# Patient Record
Sex: Female | Born: 1960 | Race: White | Hispanic: No | Marital: Single | Smoking: Former smoker
Health system: Southern US, Community
[De-identification: ages and names within clinical notes are randomized; demographics above are authoritative.]

## PROBLEM LIST (undated history)

## (undated) DIAGNOSIS — E079 Disorder of thyroid, unspecified: Secondary | ICD-10-CM

## (undated) DIAGNOSIS — F419 Anxiety disorder, unspecified: Secondary | ICD-10-CM

## (undated) DIAGNOSIS — F32A Depression, unspecified: Secondary | ICD-10-CM

## (undated) HISTORY — PX: JOINT REPLACEMENT: SHX530

---

## 2017-01-16 DIAGNOSIS — E559 Vitamin D deficiency, unspecified: Secondary | ICD-10-CM | POA: Diagnosis not present

## 2017-01-16 DIAGNOSIS — Z13220 Encounter for screening for lipoid disorders: Secondary | ICD-10-CM | POA: Diagnosis not present

## 2017-01-16 DIAGNOSIS — K219 Gastro-esophageal reflux disease without esophagitis: Secondary | ICD-10-CM | POA: Diagnosis not present

## 2017-01-16 DIAGNOSIS — R5383 Other fatigue: Secondary | ICD-10-CM | POA: Diagnosis not present

## 2017-02-14 DIAGNOSIS — R194 Change in bowel habit: Secondary | ICD-10-CM | POA: Diagnosis not present

## 2017-02-14 DIAGNOSIS — K219 Gastro-esophageal reflux disease without esophagitis: Secondary | ICD-10-CM | POA: Diagnosis not present

## 2017-02-14 DIAGNOSIS — R1310 Dysphagia, unspecified: Secondary | ICD-10-CM | POA: Diagnosis not present

## 2017-10-08 DIAGNOSIS — Z1231 Encounter for screening mammogram for malignant neoplasm of breast: Secondary | ICD-10-CM | POA: Diagnosis not present

## 2018-02-11 DIAGNOSIS — E538 Deficiency of other specified B group vitamins: Secondary | ICD-10-CM | POA: Diagnosis not present

## 2018-02-11 DIAGNOSIS — E559 Vitamin D deficiency, unspecified: Secondary | ICD-10-CM | POA: Diagnosis not present

## 2018-02-11 DIAGNOSIS — Z0000 Encounter for general adult medical examination without abnormal findings: Secondary | ICD-10-CM | POA: Diagnosis not present

## 2018-02-11 DIAGNOSIS — E7800 Pure hypercholesterolemia, unspecified: Secondary | ICD-10-CM | POA: Diagnosis not present

## 2018-02-11 DIAGNOSIS — Z1211 Encounter for screening for malignant neoplasm of colon: Secondary | ICD-10-CM | POA: Diagnosis not present

## 2018-02-11 DIAGNOSIS — Z79899 Other long term (current) drug therapy: Secondary | ICD-10-CM | POA: Diagnosis not present

## 2018-02-11 DIAGNOSIS — Z23 Encounter for immunization: Secondary | ICD-10-CM | POA: Diagnosis not present

## 2018-02-27 DIAGNOSIS — E538 Deficiency of other specified B group vitamins: Secondary | ICD-10-CM | POA: Diagnosis not present

## 2018-05-29 DIAGNOSIS — E559 Vitamin D deficiency, unspecified: Secondary | ICD-10-CM | POA: Diagnosis not present

## 2018-05-29 DIAGNOSIS — E538 Deficiency of other specified B group vitamins: Secondary | ICD-10-CM | POA: Diagnosis not present

## 2018-05-29 DIAGNOSIS — E7800 Pure hypercholesterolemia, unspecified: Secondary | ICD-10-CM | POA: Diagnosis not present

## 2018-08-20 DIAGNOSIS — H04122 Dry eye syndrome of left lacrimal gland: Secondary | ICD-10-CM | POA: Diagnosis not present

## 2018-10-01 DIAGNOSIS — B029 Zoster without complications: Secondary | ICD-10-CM | POA: Diagnosis not present

## 2018-10-01 DIAGNOSIS — N3000 Acute cystitis without hematuria: Secondary | ICD-10-CM | POA: Diagnosis not present

## 2019-02-07 DIAGNOSIS — B349 Viral infection, unspecified: Secondary | ICD-10-CM | POA: Diagnosis not present

## 2020-02-06 ENCOUNTER — Emergency Department (HOSPITAL_BASED_OUTPATIENT_CLINIC_OR_DEPARTMENT_OTHER)
Admission: EM | Admit: 2020-02-06 | Discharge: 2020-02-06 | Disposition: A | Discharge status: Home / Self Care | Payer: 59 | Attending: Emergency Medicine | Admitting: Emergency Medicine

## 2020-02-06 ENCOUNTER — Other Ambulatory Visit: Payer: Self-pay

## 2020-02-06 ENCOUNTER — Emergency Department (HOSPITAL_BASED_OUTPATIENT_CLINIC_OR_DEPARTMENT_OTHER): Payer: 59

## 2020-02-06 ENCOUNTER — Encounter (HOSPITAL_BASED_OUTPATIENT_CLINIC_OR_DEPARTMENT_OTHER): Payer: Self-pay | Admitting: *Deleted

## 2020-02-06 DIAGNOSIS — M79604 Pain in right leg: Secondary | ICD-10-CM | POA: Diagnosis not present

## 2020-02-06 DIAGNOSIS — R2243 Localized swelling, mass and lump, lower limb, bilateral: Secondary | ICD-10-CM | POA: Insufficient documentation

## 2020-02-06 DIAGNOSIS — Z87891 Personal history of nicotine dependence: Secondary | ICD-10-CM | POA: Diagnosis not present

## 2020-02-06 DIAGNOSIS — M79662 Pain in left lower leg: Secondary | ICD-10-CM | POA: Insufficient documentation

## 2020-02-06 DIAGNOSIS — M79605 Pain in left leg: Secondary | ICD-10-CM

## 2020-02-06 DIAGNOSIS — G8929 Other chronic pain: Secondary | ICD-10-CM

## 2020-02-06 DIAGNOSIS — M25512 Pain in left shoulder: Secondary | ICD-10-CM | POA: Insufficient documentation

## 2020-02-06 MED ORDER — LIDOCAINE 5 % EX PTCH: 1.0000 | MEDICATED_PATCH | CUTANEOUS | 0 refills | Status: AC

## 2020-02-06 MED ORDER — METHOCARBAMOL 500 MG PO TABS: 500.0000 mg | ORAL_TABLET | Freq: Two times a day (BID) | ORAL | 0 refills | Status: AC

## 2020-02-06 NOTE — Discharge Instructions (Addendum)
There was no evidence of blood clot in the veins of the lower extremities.  Take it easy, but do not lay around too much as this may make any stiffness worse.  Antiinflammatory medications: Take 600 mg of ibuprofen every 6 hours or 440 mg (over the counter dose) to 500 mg (prescription dose) of naproxen every 12 hours for the next 3 days. After this time, these medications may be used as needed for pain. Take these medications with food to avoid upset stomach. Choose only one of these medications, do not take them together. Acetaminophen (generic for Tylenol): Should you continue to have additional pain while taking the ibuprofen or naproxen, you may add in acetaminophen as needed. Your daily total maximum amount of acetaminophen from all sources should be limited to 4000mg /day for persons without liver problems, or 2000mg /day for those with liver problems. Methocarbamol: Methocarbamol (generic for Robaxin) is a muscle relaxer and can help relieve stiff muscles or muscle spasms.  Do not drive or perform other dangerous activities while taking this medication as it can cause drowsiness as well as changes in reaction time and judgement. Lidocaine patches: These are available via either prescription or over-the-counter. The over-the-counter option may be more economical one and are likely just as effective. There are multiple over-the-counter brands, such as Salonpas. Ice: May apply ice to the area over the next 24 hours for 15 minutes at a time to reduce pain, inflammation, and swelling, if present. Exercises: Be sure to perform the attached exercises starting with three times a week and working up to performing them daily. This is an essential part of preventing long term problems.  Follow up: Follow up with a primary care provider for any future management of these complaints. Be sure to follow up within 7-10 days.  It would also be a good idea to follow-up with a orthopedic specialist, especially for the  more chronic issues. Return: Return to the ED should symptoms worsen.  For prescription assistance, may try using prescription discount sites or apps, such as goodrx.com

## 2020-02-06 NOTE — ED Triage Notes (Signed)
Pt reports she has been packing and moving over the last week. Initially she had knee pain but then she began having pain in left leg with bruising and swelling in her left calf. Reports it feels like her foot is going numb. Also c/o left shoulder pain. Denies falling or known injury

## 2020-02-06 NOTE — ED Provider Notes (Signed)
[facility] EMERGENCY DEPARTMENT Provider Note   CSN: [id number] Arrival date & time: 02/06/20  1304     History Chief Complaint  Patient presents with  . Leg Pain    Kimberly Boyer is a 58 y.o. female.  HPI      Kimberly Boyer is a 59 y.o. female, patient with no diagnosed past medical history, presenting to the ED primarily complaining of left calf pain beginning several days ago.  At rest, she describes the pain as a soreness, 3/10, located in the posterior left calf, nonradiating.  With walking, however, she notes sharp, excruciating pain throughout the left calf.  She has also noted worsening pain overall as well as swelling and erythema.  A couple days ago she noted an area of bruising to the posterior left calf about the size of a half dollar.  Tingling in the left foot, especially with walking. She states she originally had left knee pain, but this improved and then the calf pain began. A couple days ago, she also noted to start having right calf tenderness and cramping. She adds that for the last couple weeks she has been packing and moving boxes and furniture around in anticipation of moving to a new residence that occurred 2 days ago.  She states she thought her leg pain was just from soreness from moving, but when she began to have swelling and increasingly worsened pain, she decided to get checked out. She also notes some acute on chronic lower back soreness and tightness in the muscles.  She mentions pain in the left shoulder for the past year that worsened over the past couple weeks with all of her activities. Declined analgesia during the initial interview. Denies fever/chills, shortness of breath, chest pain, cough, N/V/D, abdominal pain, syncope, changes in bowel or bladder function, saddle anesthesias, or any other complaints.   History reviewed. No pertinent past medical history.  There are no problems to display for this patient.   Past Surgical History:    Procedure Laterality Date  . CESAREAN SECTION       OB History   No obstetric history on file.     No family history on file.  Social History   Tobacco Use  . Smoking status: Former Research scientist (life sciences)  . Smokeless tobacco: Never Used  Substance Use Topics  . Alcohol use: Never  . Drug use: Never    Home Medications Prior to Admission medications   Medication Sig Start Date End Date Taking? Authorizing Provider  lidocaine (LIDODERM) 5 % Place 1 patch onto the skin daily. Remove & Discard patch within 12 hours or as directed by MD 02/06/20   Esta Carmon C, PA-C  methocarbamol (ROBAXIN) 500 MG tablet Take 1 tablet (500 mg total) by mouth 2 (two) times daily. 02/06/20   Valery Amedee, Helane Gunther, PA-C    Allergies    Patient has no known allergies.  Review of Systems   Review of Systems  Constitutional: Negative for chills, diaphoresis and fever.  Respiratory: Negative for cough and shortness of breath.   Cardiovascular: Positive for leg swelling. Negative for chest pain.  Gastrointestinal: Negative for abdominal pain, diarrhea, nausea and vomiting.  Genitourinary: Negative for difficulty urinating.  Musculoskeletal: Positive for back pain and myalgias.  Neurological: Negative for dizziness, syncope, weakness and numbness.  All other systems reviewed and are negative.   Physical Exam Updated Vital Signs BP 100/63 (BP Location: Right Arm)   Pulse 87   Temp 98.6 F (37 C) (Oral)  Resp 16   Ht 5\' 2"  (1.575 m)   Wt 106.6 kg   SpO2 100%   BMI 42.98 kg/m   Physical Exam Vitals and nursing note reviewed.  Constitutional:      General: She is not in acute distress.    Appearance: She is well-developed. She is not diaphoretic.  HENT:     Head: Normocephalic and atraumatic.     Mouth/Throat:     Mouth: Mucous membranes are moist.     Pharynx: Oropharynx is clear.  Eyes:     Conjunctiva/sclera: Conjunctivae normal.  Cardiovascular:     Rate and Rhythm: Normal rate and regular rhythm.      Pulses: Normal pulses.          Radial pulses are 2+ on the right side and 2+ on the left side.       Dorsalis pedis pulses are 2+ on the right side and 2+ on the left side.       Posterior tibial pulses are 2+ on the right side and 2+ on the left side.     Heart sounds: Normal heart sounds.     Comments: Tactile temperature in the extremities appropriate and equal bilaterally. Pulmonary:     Effort: Pulmonary effort is normal. No respiratory distress.     Breath sounds: Normal breath sounds.  Abdominal:     Palpations: Abdomen is soft.     Tenderness: There is no abdominal tenderness. There is no guarding.  Musculoskeletal:     Cervical back: Neck supple.     Right lower leg: No edema.     Left lower leg: No edema.     Comments: Left lower extremity: Patient certainly has tenderness throughout the left calf, most tender to the mid posterior left calf.  No noted objective size or color difference.  No increased warmth noted. She has some tenderness to the posterior left knee.  No swelling, deformity, color change, or increased warmth to the left knee.  No abnormalities noted in the left upper leg.  Full range of motion in the left knee and ankle without noted difficulty or pain in the joints.  However, she does have pain in the left calf with movement of the ankle. No noted pain or tenderness in the left hip.  Right lower extremity: She has an area of point tenderness to the right posterior mid calf without color change, noted swelling, increased warmth, or erythema. No noted pain or tenderness in the right hip.  Patient certainly has tenderness to left anterior shoulder.  She has limited range of motion with abduction limited to about 90 degrees.  She states this is not new and has been this way for at least a year. No noted abnormalities in the left elbow or wrist.  Tenderness along the musculature of the lumbar back.  Lymphadenopathy:     Cervical: No cervical adenopathy.  Skin:     General: Skin is warm and dry.  Neurological:     Mental Status: She is alert.     Comments: Sensation to light touch grossly intact in each of the extremities.  However, patient does note a tingling sensation when most of the left foot is palpated.  This is in a low sock type of distribution inferior to the malleoli.  Strength against resistance intact in the left big toe.   Strength with flexion and extension intact in the left ankle and knee.  Patient is ambulatory with a limping gait.  Strength 5/5  throughout the right lower extremity.  Strength 5/5 in the left elbow and wrist.  Strength in the left shoulder seems to be limited by pain.  Psychiatric:        Mood and Affect: Mood and affect normal.        Speech: Speech normal.        Behavior: Behavior normal.     ED Results / Procedures / Treatments   Labs (all labs ordered are listed, but only abnormal results are displayed) Labs Reviewed - No data to display  EKG None  Radiology US Venous Img Lower Bilateral  Result Date: 02/06/2020 CLINICAL DATA:  Bilateral calf cramping and pain, left foot numbness EXAM: BILATERAL LOWER EXTREMITY VENOUS DOPPLER ULTRASOUND TECHNIQUE: Gray-scale sonography with graded compression, as well as color Doppler and duplex ultrasound were performed to evaluate the lower extremity deep venous systems from the level of the common femoral vein and including the common femoral, femoral, profunda femoral, popliteal and calf veins including the posterior tibial, peroneal and gastrocnemius veins when visible. The superficial great saphenous vein was also interrogated. Spectral Doppler was utilized to evaluate flow at rest and with distal augmentation maneuvers in the common femoral, femoral and popliteal veins. COMPARISON:  None. FINDINGS: RIGHT LOWER EXTREMITY Common Femoral Vein: No evidence of thrombus. Normal compressibility, respiratory phasicity and response to augmentation. Saphenofemoral Junction: No  evidence of thrombus. Normal compressibility and flow on color Doppler imaging. Profunda Femoral Vein: No evidence of thrombus. Normal compressibility and flow on color Doppler imaging. Femoral Vein: No evidence of thrombus. Normal compressibility, respiratory phasicity and response to augmentation. Popliteal Vein: No evidence of thrombus. Normal compressibility, respiratory phasicity and response to augmentation. Calf Veins: No evidence of thrombus. Normal compressibility and flow on color Doppler imaging. Superficial Great Saphenous Vein: No evidence of thrombus. Normal compressibility. Venous Reflux:  None. Other Findings:  None. LEFT LOWER EXTREMITY Common Femoral Vein: No evidence of thrombus. Normal compressibility, respiratory phasicity and response to augmentation. Saphenofemoral Junction: No evidence of thrombus. Normal compressibility and flow on color Doppler imaging. Profunda Femoral Vein: No evidence of thrombus. Normal compressibility and flow on color Doppler imaging. Femoral Vein: No evidence of thrombus. Normal compressibility, respiratory phasicity and response to augmentation. Popliteal Vein: No evidence of thrombus. Normal compressibility, respiratory phasicity and response to augmentation. Calf Veins: No evidence of thrombus. Normal compressibility and flow on color Doppler imaging. Superficial Great Saphenous Vein: No evidence of thrombus. Normal compressibility. Venous Reflux:  None. Other Findings:  None. IMPRESSION: No evidence of deep venous thrombosis in either lower extremity. Electronically Signed   By: Sharlet Salina M.D.   On: 02/06/2020 15:55    Procedures Procedures (including critical care time)  Medications Ordered in ED Medications - No data to display  ED Course  I have reviewed the triage vital signs and the nursing notes.  Pertinent labs & imaging results that were available during my care of the patient were reviewed by me and considered in my medical decision  making (see chart for details).    MDM Rules/Calculators/A&P                      Patient presents primarily complaining of calf pain. Also has pain in her lower back and left shoulder, but these are not acute. No evidence of neurovascular compromise. Venous Doppler ultrasounds of bilateral lower extremities without evidence of DVT. Patient referred to orthopedics for her other areas of pain. The patient was given instructions for home  care as well as return precautions. Patient voices understanding of these instructions, accepts the plan, and is comfortable with discharge.  Final Clinical Impression(s) / ED Diagnoses Final diagnoses:  Pain in both lower extremities  Chronic left shoulder pain    Rx / DC Orders ED Discharge Orders         Ordered    methocarbamol (ROBAXIN) 500 MG tablet  2 times daily     02/06/20 1628    lidocaine (LIDODERM) 5 %  Every 24 hours     02/06/20 1628           Anselm Pancoast, PA-C 02/06/20 1643    Tegeler, Canary Brim, MD 02/10/20 1416

## 2020-02-15 ENCOUNTER — Other Ambulatory Visit: Payer: Self-pay

## 2020-02-15 ENCOUNTER — Encounter: Payer: Self-pay | Admitting: Orthopaedic Surgery

## 2020-02-15 ENCOUNTER — Ambulatory Visit (INDEPENDENT_AMBULATORY_CARE_PROVIDER_SITE_OTHER): Payer: 59 | Admitting: Orthopaedic Surgery

## 2020-02-15 ENCOUNTER — Ambulatory Visit: Payer: Self-pay

## 2020-02-15 VITALS — BP 153/86 | HR 93 | Ht 62.0 in | Wt 235.0 lb

## 2020-02-15 DIAGNOSIS — M545 Low back pain, unspecified: Secondary | ICD-10-CM

## 2020-02-15 DIAGNOSIS — M5442 Lumbago with sciatica, left side: Secondary | ICD-10-CM | POA: Diagnosis not present

## 2020-02-15 NOTE — Progress Notes (Signed)
Office Visit Note   Patient: Kimberly Boyer           Date of Birth: 20-Jun-1961           MRN: [id number] Visit Date: 02/15/2020              Requested by: Mila Palmer, MD [address] [place],  [place] [address] PCP: Mila Palmer, MD   Assessment & Plan: Visit Diagnoses:  1. Acute bilateral low back pain, unspecified whether sciatica present   2. Acute left-sided low back pain with left-sided sciatica     Plan: Patient had a cortisone injection last week in her left knee without improvement.  She is having back and left leg pain with numbness.  We will set her up for some physical therapy she is concerned about the cost with the insurance that she has and would like to discuss with them a home exercise program that she can work on.  I plan to recheck her in a month if she is having persistent symptoms we will need to consider MRI imaging of the lumbar spine.  Follow-Up Instructions: Return in about 1 month (around 03/14/2020).   Orders:  Orders Placed This Encounter  Procedures  . XR Lumbar Spine 2-3 Views  . Ambulatory referral to Physical Therapy   No orders of the defined types were placed in this encounter.     Procedures: No procedures performed   Clinical Data: No additional findings.   Subjective: Chief Complaint  Patient presents with  . Lower Back - Pain  . Left Leg - Pain    HPI 59 year old female who was seen [facility] week saw the PA was not happy she did not see Dr. Thomasena Edis.  She states she had moved houses had move her stomach but she did a lot of packing bending and had increased back pain left leg pain.  Husband passed away a few years ago and she states she did put on some weight.  She denied problems with radicular pain prior to the packing she did from the moving.  She states she had an injection in her left knee but continues to have back pain left calf pain some left foot numbness.  She is occasionally had  cramping in the calf.  She feels like her left knee will hyperextend at times.  She used some Robaxin also some ibuprofen.  Negative bowel or bladder symptoms no fever chills associated.  Review of Systems positive for back pain left leg more than right leg pain negative Doppler test for DVT.  No popliteal cyst noted.  Positive for weight gain last years after her husband's death.  Negative for diabetes negative for chest pain or stroke symptoms.    Objective: Vital Signs: BP (!) 153/86   Pulse 93   Ht 5\' 2"  (1.575 m)   Wt 235 lb (106.6 kg)   BMI 42.98 kg/m   Physical Exam Constitutional:      Appearance: She is well-developed.  HENT:     Head: Normocephalic.     Right Ear: External ear normal.     Left Ear: External ear normal.  Eyes:     Pupils: Pupils are equal, round, and reactive to light.  Neck:     Thyroid: No thyromegaly.     Trachea: No tracheal deviation.  Cardiovascular:     Rate and Rhythm: Normal rate.  Pulmonary:     Effort: Pulmonary effort is normal.  Abdominal:  Palpations: Abdomen is soft.  Skin:    General: Skin is warm and dry.  Neurological:     Mental Status: She is alert and oriented to person, place, and time.  Psychiatric:        Behavior: Behavior normal.     Ortho Exam patient has sharp pain with straight leg raising on the left at 60 degrees.  She extends at the hip.  Pain with popliteal compression.  Lateral plantar foot numbness.  Peroneals are strong knee and ankle jerk are intact.  Mild trochanteric tenderness some sciatic notch tenderness left greater than right mild discomfort with palpation over the lumbosacral junction.  No midline defects.  Anterior tib gastrocsoleus is strong she is able to heel and toe walk with slight difficulty.  No crepitus with left knee range of motion collateral crucial ligament exam is normal left knee.  Specialty Comments:  No specialty comments available.  Imaging: XR Lumbar Spine 2-3 Views  Result  Date: 02/15/2020 AP lateral lumbar spine demonstrate normal lumbar curvature no disc base narrowing negative for acute changes. Impression: Lumbar x-rays negative for acute changes.    PMFS History: Patient Active Problem List   Diagnosis Date Noted  . Low back pain 02/15/2020   No past medical history on file.  No family history on file.  Past Surgical History:  Procedure Laterality Date  . CESAREAN SECTION     Social History   Occupational History  . Not on file  Tobacco Use  . Smoking status: Former Research scientist (life sciences)  . Smokeless tobacco: Never Used  Substance and Sexual Activity  . Alcohol use: Never  . Drug use: Never  . Sexual activity: Not on file

## 2020-04-24 ENCOUNTER — Encounter (HOSPITAL_BASED_OUTPATIENT_CLINIC_OR_DEPARTMENT_OTHER): Payer: Self-pay | Admitting: *Deleted

## 2020-04-24 ENCOUNTER — Other Ambulatory Visit: Payer: Self-pay

## 2020-04-24 ENCOUNTER — Emergency Department (HOSPITAL_BASED_OUTPATIENT_CLINIC_OR_DEPARTMENT_OTHER): Payer: 59

## 2020-04-24 ENCOUNTER — Emergency Department (HOSPITAL_BASED_OUTPATIENT_CLINIC_OR_DEPARTMENT_OTHER)
Admission: EM | Admit: 2020-04-24 | Discharge: 2020-04-24 | Disposition: A | Discharge status: Home / Self Care | Payer: 59 | Attending: Emergency Medicine | Admitting: Emergency Medicine

## 2020-04-24 DIAGNOSIS — Y9389 Activity, other specified: Secondary | ICD-10-CM | POA: Diagnosis not present

## 2020-04-24 DIAGNOSIS — Y929 Unspecified place or not applicable: Secondary | ICD-10-CM | POA: Insufficient documentation

## 2020-04-24 DIAGNOSIS — S42351A Displaced comminuted fracture of shaft of humerus, right arm, initial encounter for closed fracture: Secondary | ICD-10-CM | POA: Insufficient documentation

## 2020-04-24 DIAGNOSIS — W01198A Fall on same level from slipping, tripping and stumbling with subsequent striking against other object, initial encounter: Secondary | ICD-10-CM | POA: Insufficient documentation

## 2020-04-24 DIAGNOSIS — S42201A Unspecified fracture of upper end of right humerus, initial encounter for closed fracture: Secondary | ICD-10-CM

## 2020-04-24 DIAGNOSIS — Y999 Unspecified external cause status: Secondary | ICD-10-CM | POA: Insufficient documentation

## 2020-04-24 DIAGNOSIS — S0990XA Unspecified injury of head, initial encounter: Secondary | ICD-10-CM | POA: Diagnosis not present

## 2020-04-24 DIAGNOSIS — S4991XA Unspecified injury of right shoulder and upper arm, initial encounter: Secondary | ICD-10-CM | POA: Insufficient documentation

## 2020-04-24 DIAGNOSIS — S161XXA Strain of muscle, fascia and tendon at neck level, initial encounter: Secondary | ICD-10-CM | POA: Diagnosis not present

## 2020-04-24 DIAGNOSIS — W19XXXA Unspecified fall, initial encounter: Secondary | ICD-10-CM

## 2020-04-24 DIAGNOSIS — S199XXA Unspecified injury of neck, initial encounter: Secondary | ICD-10-CM | POA: Diagnosis present

## 2020-04-24 DIAGNOSIS — S0081XA Abrasion of other part of head, initial encounter: Secondary | ICD-10-CM | POA: Diagnosis not present

## 2020-04-24 MED ORDER — OXYCODONE-ACETAMINOPHEN 5-325 MG PO TABS: 1.0000 | ORAL_TABLET | Freq: Four times a day (QID) | ORAL | 0 refills | Status: AC | PRN

## 2020-04-24 MED ORDER — NAPROXEN 500 MG PO TABS: 500.0000 mg | ORAL_TABLET | Freq: Two times a day (BID) | ORAL | 0 refills | Status: AC | PRN

## 2020-04-24 NOTE — ED Triage Notes (Signed)
Pt reports falling tonight and landing on her right shoulder-deformity noted. Also reports hitting head, denies LOC. No laceration or abrasion noted.

## 2020-04-24 NOTE — Discharge Instructions (Signed)
You were seen in the emergency department today after fall.  You do have a fracture of the right upper arm.  Please keep the sling in place and apply ice to help reduce swelling over the next 24 hours.  Please call the orthopedic surgeon first thing tomorrow morning to schedule a follow-up appointment in the coming week.  They will discuss management options with you moving forward.  I have prescribed a pain medication called Percocet.  You cannot take other sedating or pain medications along with this.  Do not drink alcohol with this medication.  You cannot drive while taking this medication.  It can cause some constipation and you can take over-the-counter constipation medications for this.  You can also take Tylenol and/or Motrin but be advised that Percocet does include a small amount of Tylenol and you should consider this when dosing additional Tylenol so that you do not take more than 4000 mg in a 24 hour period.   Return to the emergency department any new or suddenly worsening symptoms.

## 2020-04-24 NOTE — ED Provider Notes (Signed)
Emergency Department Provider Note   I have reviewed the triage vital signs and the nursing notes.   HISTORY  Chief Complaint Fall   HPI Kimberly Boyer is a 59 y.o. female with past medical history of lower back pain presents to the emergency department after mechanical fall at home.  Patient states that she was in some boots and thinks she may have either tripped on the carpet or slipped on the wet tile as she came into the house.  She fell on her right side striking her forehead on the fireplace.  She denies loss of consciousness.  No near-syncope symptoms prior to falling.  She did not try to catch herself on her outstretched hand.  She landed primarily on the right shoulder and had severe pain in that area which is worse with movement.  No pain in the elbow or wrist on the right or left upper extremity.  No pain in the legs, back, abdomen, chest.  She denies shortness of breath.  No loss of consciousness.  Patient is not on blood thinners.  Denies numbness.     History reviewed. No pertinent past medical history.  Patient Active Problem List   Diagnosis Date Noted  . Low back pain 02/15/2020    Past Surgical History:  Procedure Laterality Date  . CESAREAN SECTION      Allergies Patient has no known allergies.  History reviewed. No pertinent family history.  Social History Social History   Tobacco Use  . Smoking status: Former Research scientist (life sciences)  . Smokeless tobacco: Never Used  Substance Use Topics  . Alcohol use: Never  . Drug use: Never    Review of Systems  Constitutional: No fever/chills Eyes: No visual changes. ENT: No sore throat. Cardiovascular: Denies chest pain. Respiratory: Denies shortness of breath. Gastrointestinal: No abdominal pain.  No nausea, no vomiting.  No diarrhea.  No constipation. Genitourinary: Negative for dysuria. Musculoskeletal: Chronic back pain. Positive right shoulder pain.  Skin: Negative for rash. Neurological: Negative for headaches,  focal weakness or numbness.  10-point ROS otherwise negative.  ____________________________________________   PHYSICAL EXAM:  VITAL SIGNS: ED Triage Vitals  Enc Vitals Group     BP 04/24/20 1943 93/62     Pulse Rate 04/24/20 1941 92     Resp 04/24/20 1941 18     Temp 04/24/20 1941 99.8 F (37.7 C)     Temp Source 04/24/20 1941 Oral     SpO2 04/24/20 1941 98 %     Weight 04/24/20 1942 235 lb (106.6 kg)     Height 04/24/20 1942 5\' 2"  (1.575 m)   Constitutional: Alert and oriented. Well appearing and in no acute distress. Eyes: Conjunctivae are normal.  Head: Small abrasion over the right forehead without hematoma or laceration.  Nose: No congestion/rhinnorhea. Mouth/Throat: Mucous membranes are moist.  Neck: No stridor.  No cervical spine tenderness to palpation. Cardiovascular: Normal rate, regular rhythm. Good peripheral circulation. Grossly normal heart sounds.   Respiratory: Normal respiratory effort.  No retractions. Lungs CTAB. Gastrointestinal: Soft and nontender. No distention.  Musculoskeletal: Focal tenderness over the right upper arm with swelling.  No tenderness over the right elbow or wrist.  Normal neurovascular exam of the right upper extremity.  Normal range of motion of the left upper extremity and bilateral lower extremities. No focal tenderness.  Neurologic:  Normal speech and language. No gross focal neurologic deficits are appreciated.  Skin:  Skin is warm, dry and intact. No rash noted.  ____________________________________________  [id number]  DG Shoulder Right  Result Date: 04/24/2020 CLINICAL DATA:  Deformity after a fall EXAM: RIGHT SHOULDER - 2+ VIEW COMPARISON:  None. FINDINGS: Humeral head projects over the glenoid fossa. Acute comminuted fracture involving the neck and proximal shaft of the humerus. Mild valgus and posterior angulation of distal fracture fragment. IMPRESSION: Acute comminuted and angulated fracture involving the neck and proximal  shaft of the humerus. Electronically Signed   By: Jasmine Pang M.D.   On: 04/24/2020 20:18   CT HEAD WO CONTRAST  Result Date: 04/24/2020 CLINICAL DATA:  Fall. Head trauma. Headache. EXAM: CT HEAD WITHOUT CONTRAST CT CERVICAL SPINE WITHOUT CONTRAST TECHNIQUE: Multidetector CT imaging of the head and cervical spine was performed following the standard protocol without intravenous contrast. Multiplanar CT image reconstructions of the cervical spine were also generated. COMPARISON:  None. FINDINGS: CT HEAD FINDINGS Brain: There is no evidence for acute hemorrhage, hydrocephalus, mass lesion, or abnormal extra-axial fluid collection. No definite CT evidence for acute infarction. Vascular: No hyperdense vessel or unexpected calcification. Skull: No evidence for fracture. No worrisome lytic or sclerotic lesion. Sinuses/Orbits: The visualized paranasal sinuses and mastoid air cells are clear. Visualized portions of the globes and intraorbital fat are unremarkable. Other: None. CT CERVICAL SPINE FINDINGS Alignment: Normal. Skull base and vertebrae: No acute fracture. No primary bone lesion or focal pathologic process. Soft tissues and spinal canal: No prevertebral fluid or swelling. No visible canal hematoma. Disc levels:  Loss of disc height noted C5-6. Upper chest: Unremarkable Other: None. IMPRESSION: 1. No acute intracranial abnormality. 2. No cervical spine fracture. Loss of disc height noted at C5-6. Electronically Signed   By: Kennith Center M.D.   On: 04/24/2020 20:26   CT Cervical Spine Wo Contrast  Result Date: 04/24/2020 CLINICAL DATA:  Fall. Head trauma. Headache. EXAM: CT HEAD WITHOUT CONTRAST CT CERVICAL SPINE WITHOUT CONTRAST TECHNIQUE: Multidetector CT imaging of the head and cervical spine was performed following the standard protocol without intravenous contrast. Multiplanar CT image reconstructions of the cervical spine were also generated. COMPARISON:  None. FINDINGS: CT HEAD FINDINGS Brain: There  is no evidence for acute hemorrhage, hydrocephalus, mass lesion, or abnormal extra-axial fluid collection. No definite CT evidence for acute infarction. Vascular: No hyperdense vessel or unexpected calcification. Skull: No evidence for fracture. No worrisome lytic or sclerotic lesion. Sinuses/Orbits: The visualized paranasal sinuses and mastoid air cells are clear. Visualized portions of the globes and intraorbital fat are unremarkable. Other: None. CT CERVICAL SPINE FINDINGS Alignment: Normal. Skull base and vertebrae: No acute fracture. No primary bone lesion or focal pathologic process. Soft tissues and spinal canal: No prevertebral fluid or swelling. No visible canal hematoma. Disc levels:  Loss of disc height noted C5-6. Upper chest: Unremarkable Other: None. IMPRESSION: 1. No acute intracranial abnormality. 2. No cervical spine fracture. Loss of disc height noted at C5-6. Electronically Signed   By: Kennith Center M.D.   On: 04/24/2020 20:26    ____________________________________________   PROCEDURES  Procedure(s) performed:   Procedures  None  ____________________________________________   INITIAL IMPRESSION / ASSESSMENT AND PLAN / ED COURSE  Pertinent labs & imaging results that were available during my care of the patient were reviewed by me and considered in my medical decision making (see chart for details).   Patient presents emergency department for evaluation of acute onset right shoulder pain after falling.  Plain film shows comminuted, angulated proximal humerus fracture on the right.  No additional areas of tenderness on my exam.  No concern  clinically for compartment syndrome.  The extremity is neurovascularly intact.  Fall appears to have been mechanical.  CT head and cervical spine reviewed with no acute findings.  Patient placed in sling which has significantly improved her discomfort.  She did not require pain medications here in the emergency department.  I reviewed the  [place] drug database and will send the patient home with a prescription for Percocet.  We discussed the risks and benefits of this medication.  She will call the orthopedic surgeon tomorrow morning to schedule follow-up.  Contact information provided.  Also provided a work note as she works in an office setting with lots of typing. She is right hand dominant.    ____________________________________________  FINAL CLINICAL IMPRESSION(S) / ED DIAGNOSES  Final diagnoses:  Fall, initial encounter  Injury of head, initial encounter  Strain of neck muscle, initial encounter  Closed fracture of proximal end of right humerus, unspecified fracture morphology, initial encounter     NEW OUTPATIENT MEDICATIONS STARTED DURING THIS VISIT:  New Prescriptions   NAPROXEN (NAPROSYN) 500 MG TABLET    Take 1 tablet (500 mg total) by mouth 2 (two) times daily as needed for moderate pain.   OXYCODONE-ACETAMINOPHEN (PERCOCET/ROXICET) 5-325 MG TABLET    Take 1 tablet by mouth every 6 (six) hours as needed for severe pain.    Note:  This document was prepared using Dragon voice recognition software and may include unintentional dictation errors.  Alona Bene, MD, [facility]    Katrinia Straker, Arlyss Repress, MD 04/24/20 2041

## 2021-01-01 IMAGING — CT CT HEAD W/O CM
3 series · 15 of 47 positions shown, 18 images · non-contrast
Comparison: None.

CLINICAL DATA: Fall. Head trauma. Headache.

EXAM:
CT HEAD WITHOUT CONTRAST
CT CERVICAL SPINE WITHOUT CONTRAST
TECHNIQUE: Multidetector CT imaging of the head and cervical spine was
performed following the standard protocol without intravenous
contrast. Multiplanar CT image reconstructions of the cervical spine
were also generated.

[Series 2: head 5.0 h30s · axial · 0.46mm/px · z∈[+1388,+1518]mm · 9 of 32 slices shown, 12 images]
[im 3/32  brain]
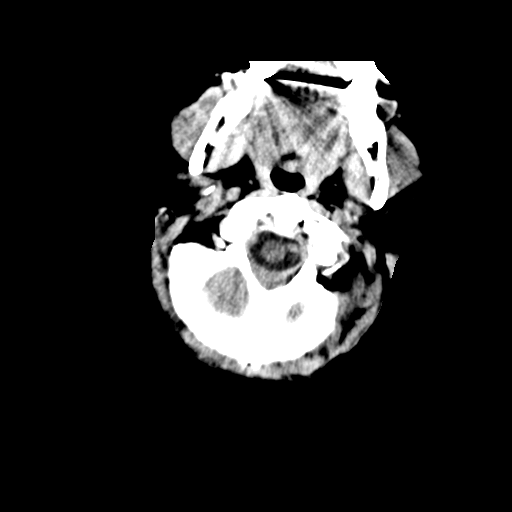
[im 3/32  bone]
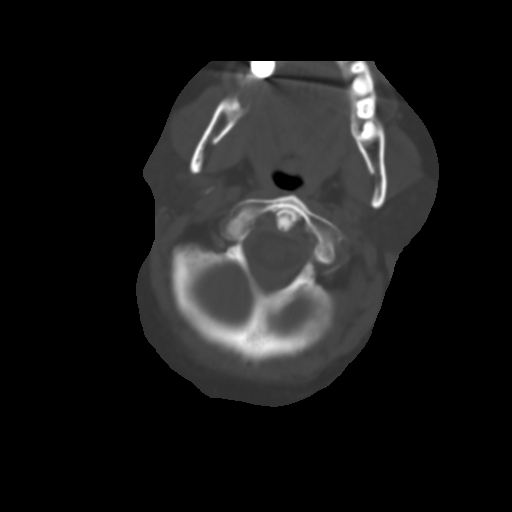
[im 6/32  brain]
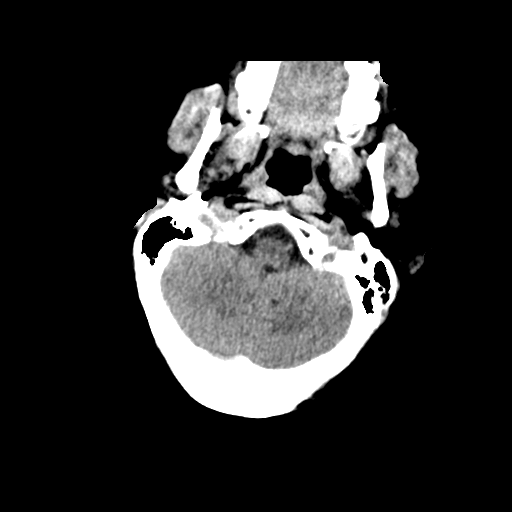
[im 9/32  brain]
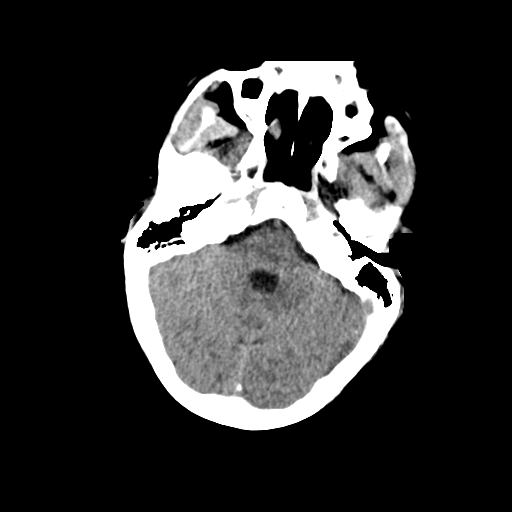
[im 12/32  brain]
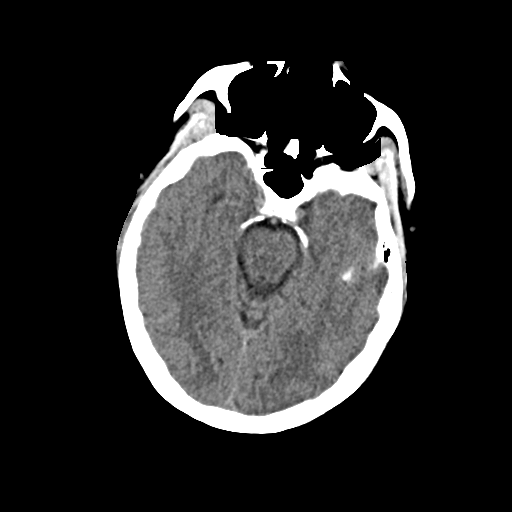
[im 17/32  brain]
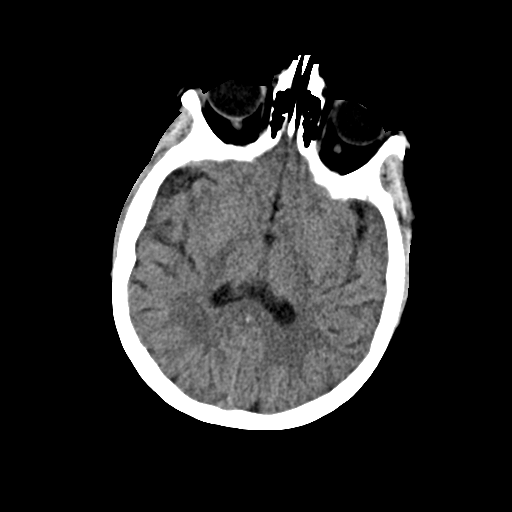
[im 17/32  bone]
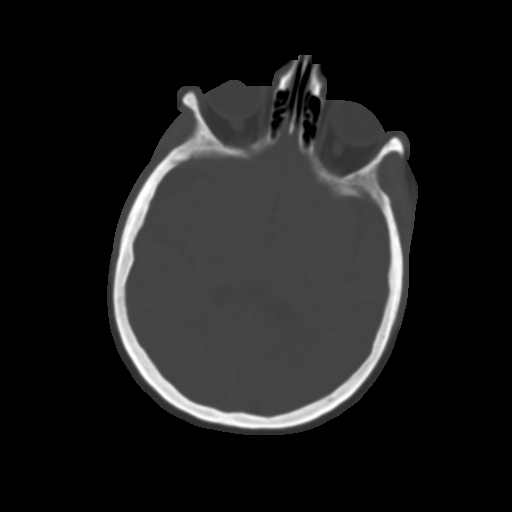
[im 20/32  brain]
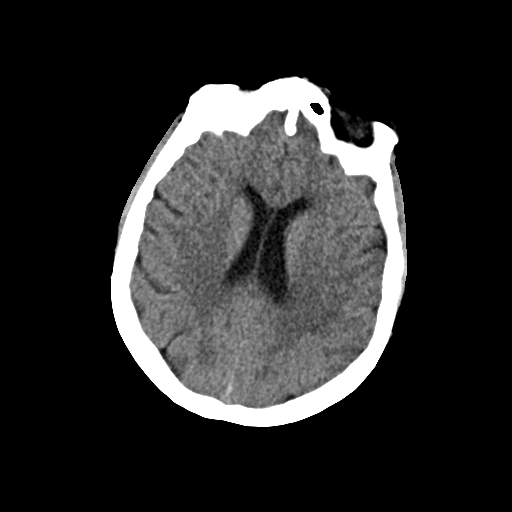
[im 23/32  brain]
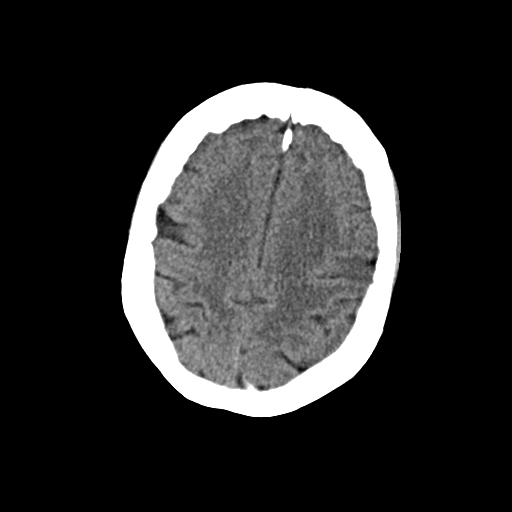
[im 26/32  brain]
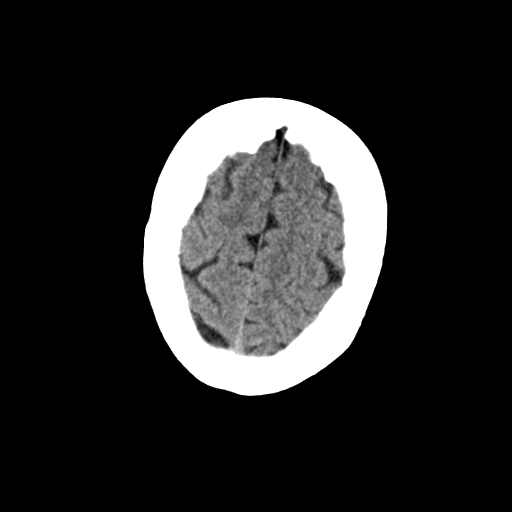
[im 29/32  brain]
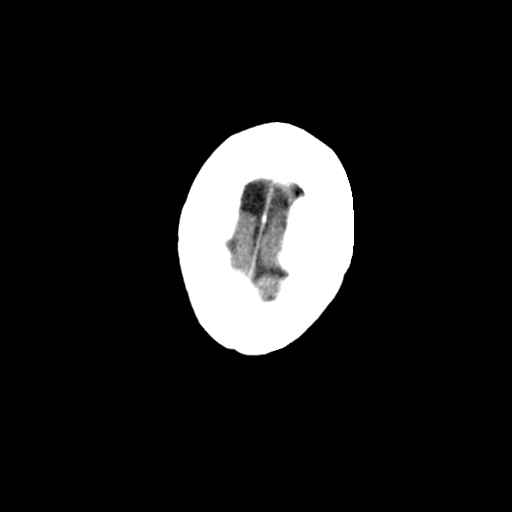
[im 29/32  bone]
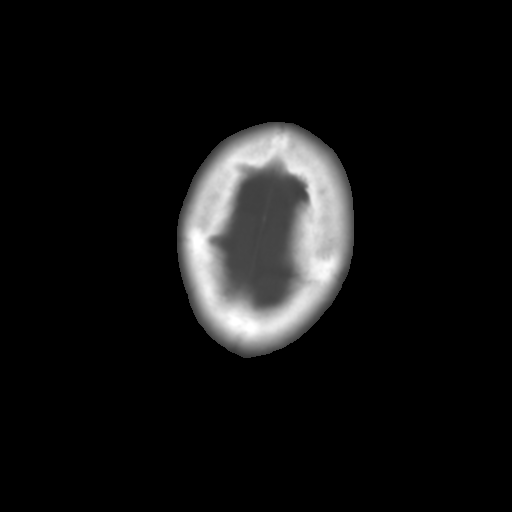

[Series 4: head 3.0 mpr cor · coronal · 0.33mm/px · 3 of 67 slices shown]
[im 23/67  brain]
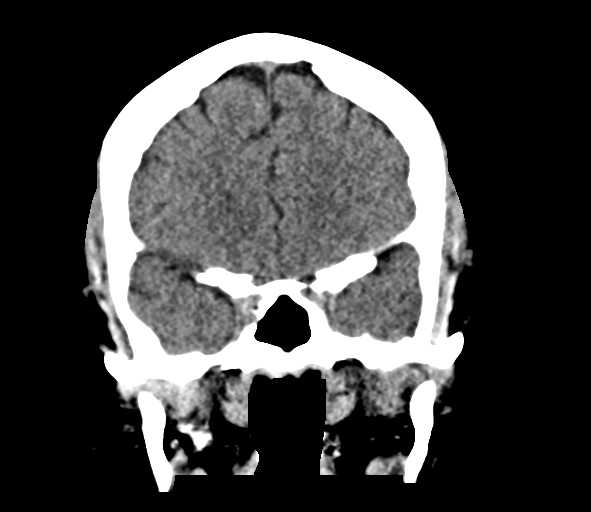
[im 30/67  brain]
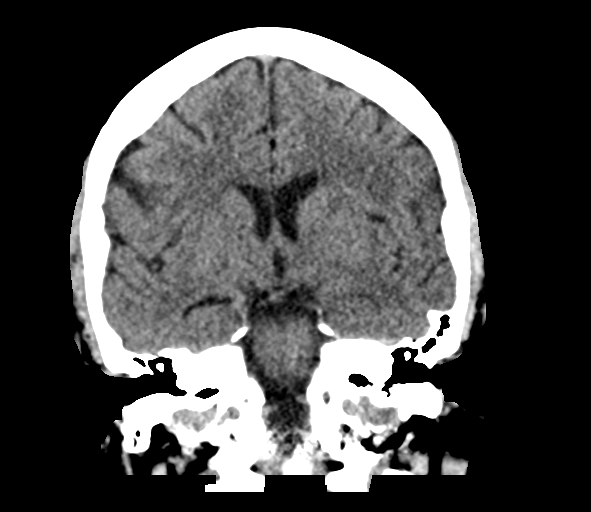
[im 37/67  brain]
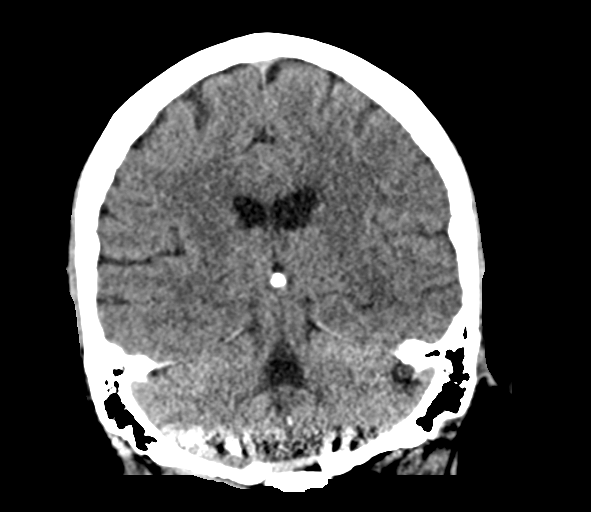

[Series 5: head 3.0 mpr sag · sagittal · 0.30mm/px · 3 of 59 slices shown]
[im 20/59  brain]
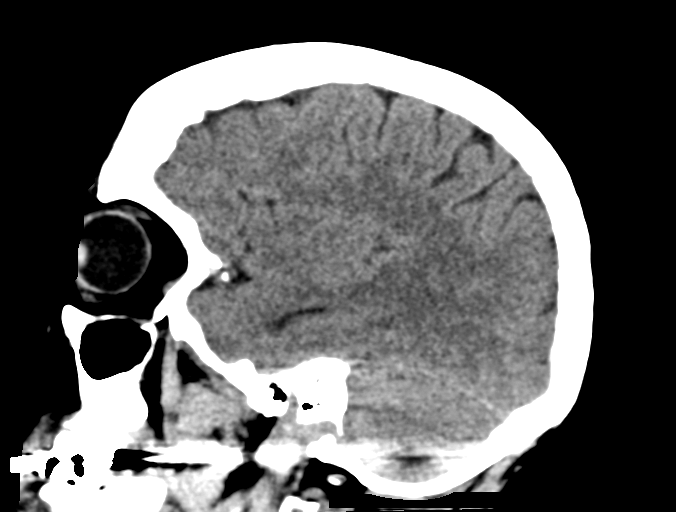
[im 30/59  brain]
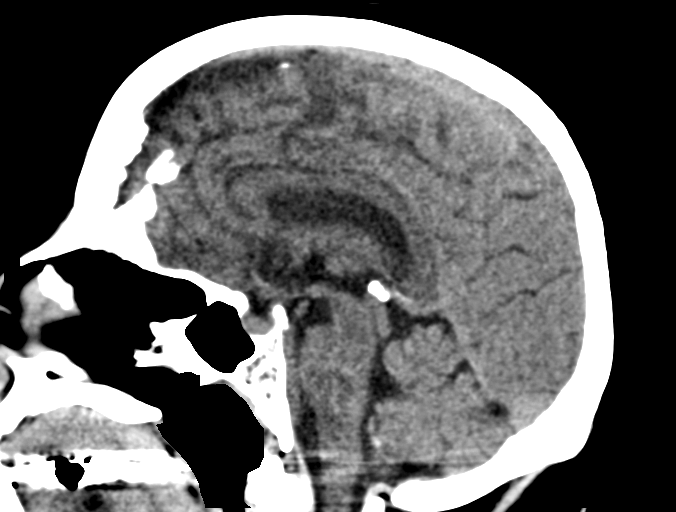
[im 39/59  brain]
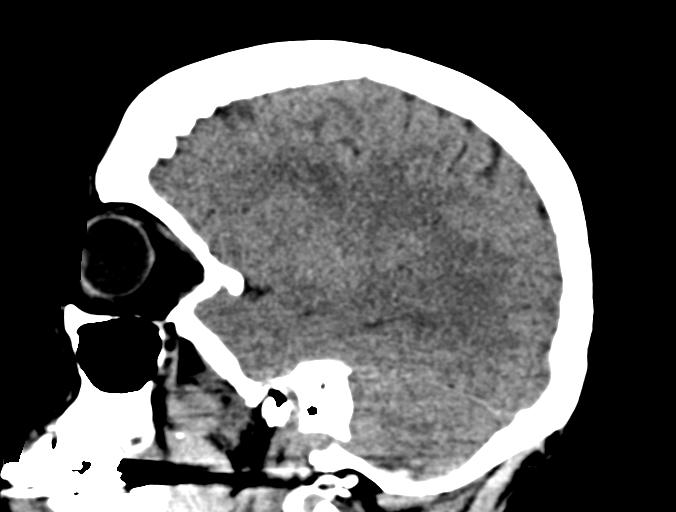

[15 of 47 positions shown; findings below may reference images not displayed]

FINDINGS: CT HEAD FINDINGS

Brain: There is no evidence for acute hemorrhage, hydrocephalus,
mass lesion, or abnormal extra-axial fluid collection. No definite
CT evidence for acute infarction.

Vascular: No hyperdense vessel or unexpected calcification.

Skull: No evidence for fracture. No worrisome lytic or sclerotic
lesion.

Sinuses/Orbits: The visualized paranasal sinuses and mastoid air
cells are clear. Visualized portions of the globes and intraorbital
fat are unremarkable.

Other: None.

CT CERVICAL SPINE FINDINGS

Alignment: Normal.

Skull base and vertebrae: No acute fracture. No primary bone lesion
or focal pathologic process.

Soft tissues and spinal canal: No prevertebral fluid or swelling. No
visible canal hematoma.

Disc levels:  Loss of disc height noted C5-6.

Upper chest: Unremarkable

Other: None.
IMPRESSION: 1. No acute intracranial abnormality.
2. No cervical spine fracture. Loss of disc height noted at C5-6.

## 2021-08-24 ENCOUNTER — Other Ambulatory Visit (HOSPITAL_COMMUNITY)
Admission: RE | Admit: 2021-08-24 | Discharge: 2021-08-24 | Disposition: A | Discharge status: Home / Self Care | Payer: 59 | Source: Ambulatory Visit | Attending: Family Medicine | Admitting: Family Medicine

## 2021-08-24 ENCOUNTER — Other Ambulatory Visit: Payer: Self-pay | Admitting: Family Medicine

## 2021-08-24 DIAGNOSIS — Z01411 Encounter for gynecological examination (general) (routine) with abnormal findings: Secondary | ICD-10-CM | POA: Diagnosis not present

## 2021-08-31 LAB — CYTOLOGY - PAP
Comment: NEGATIVE
Diagnosis: NEGATIVE
High risk HPV: NEGATIVE

## 2021-09-07 ENCOUNTER — Other Ambulatory Visit: Payer: Self-pay | Admitting: Family Medicine

## 2021-09-07 ENCOUNTER — Ambulatory Visit
Admission: RE | Admit: 2021-09-07 | Discharge: 2021-09-07 | Disposition: A | Discharge status: Home / Self Care | Payer: 59 | Source: Ambulatory Visit | Attending: Family Medicine | Admitting: Family Medicine

## 2021-09-07 DIAGNOSIS — R053 Chronic cough: Secondary | ICD-10-CM

## 2022-05-17 IMAGING — CR DG CHEST 2V
2 series · 2 of 2 positions shown · non-contrast
Comparison: None.

CLINICAL DATA: 60-year-old female with chronic cough

EXAM:
CHEST - 2 VIEW

[w chest pa]
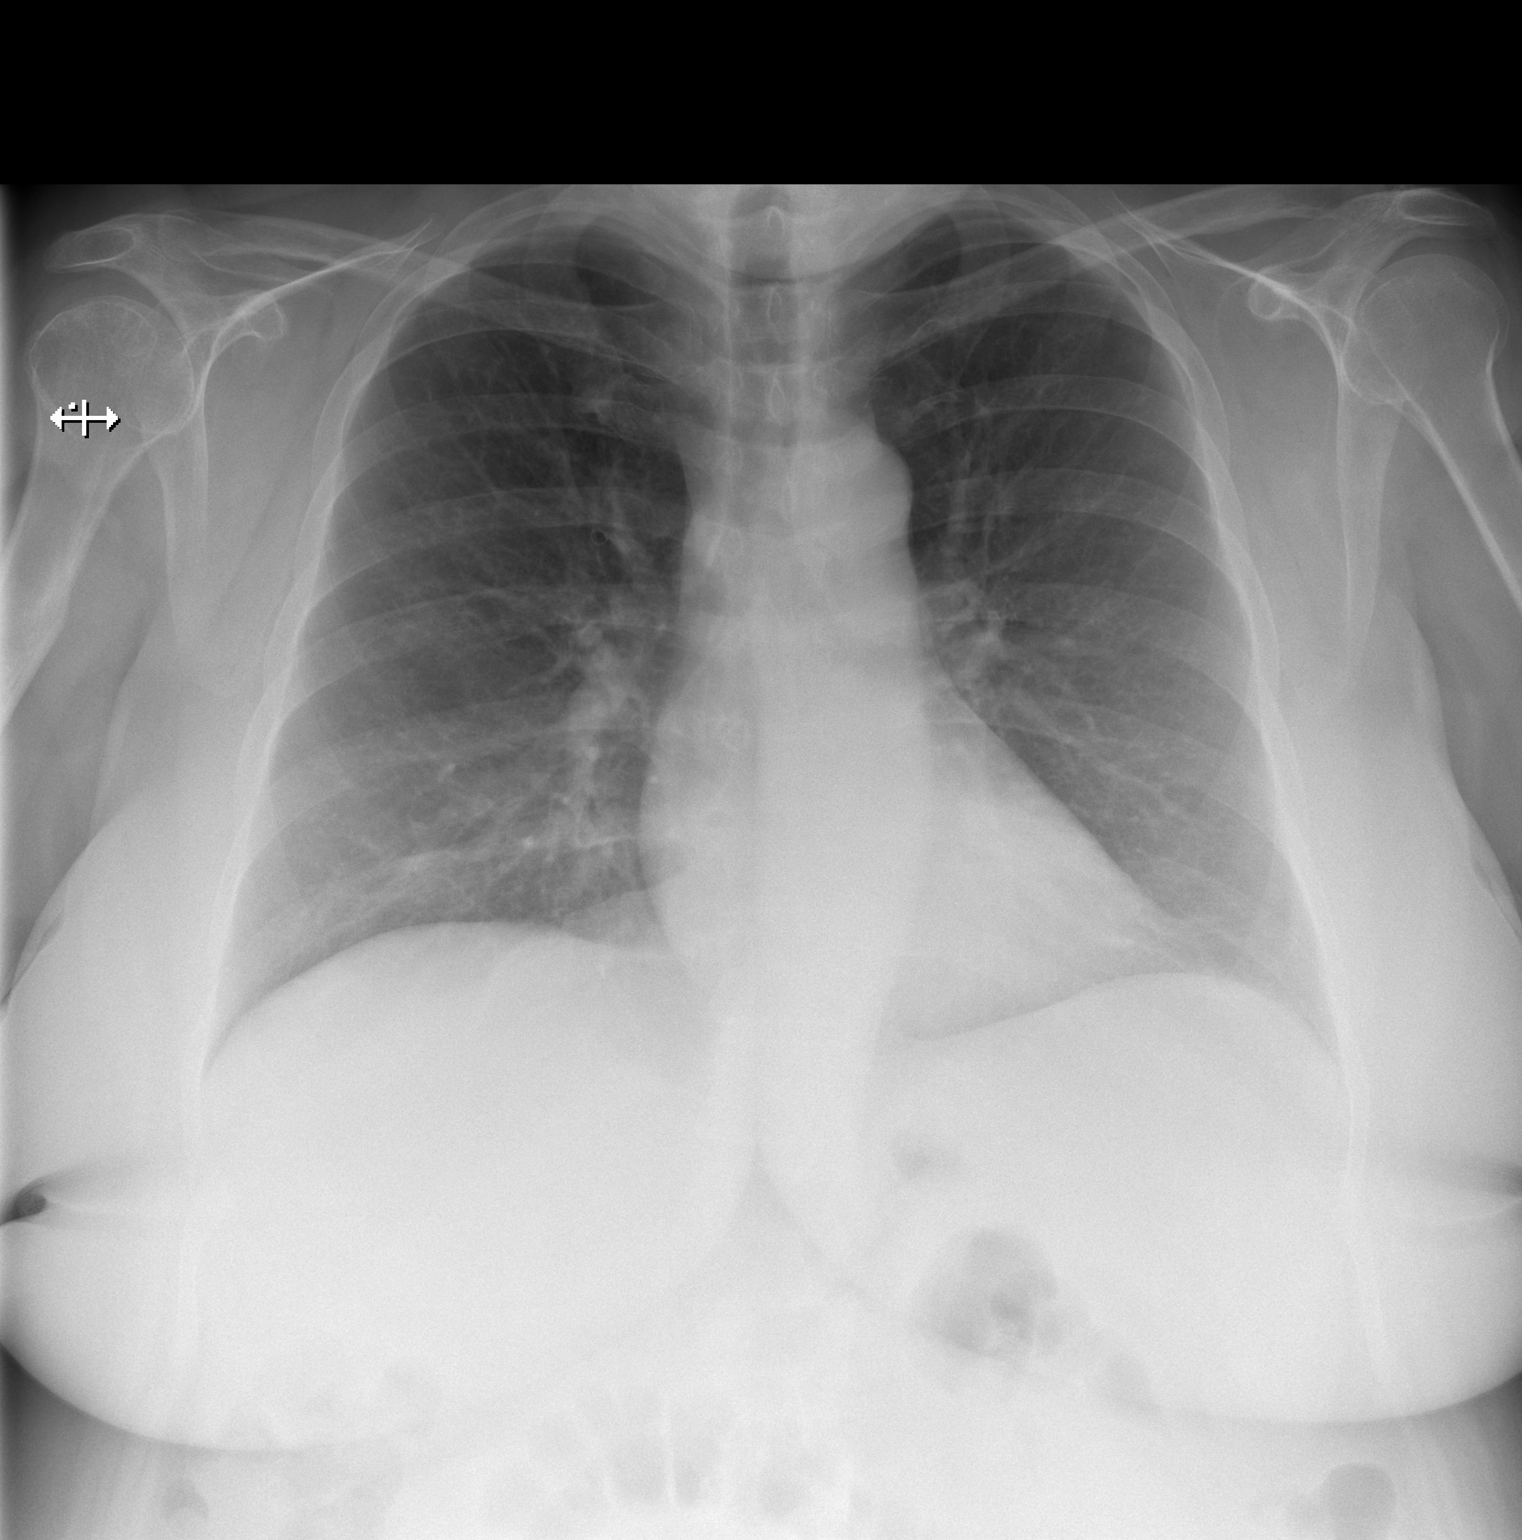

[w chest lat]
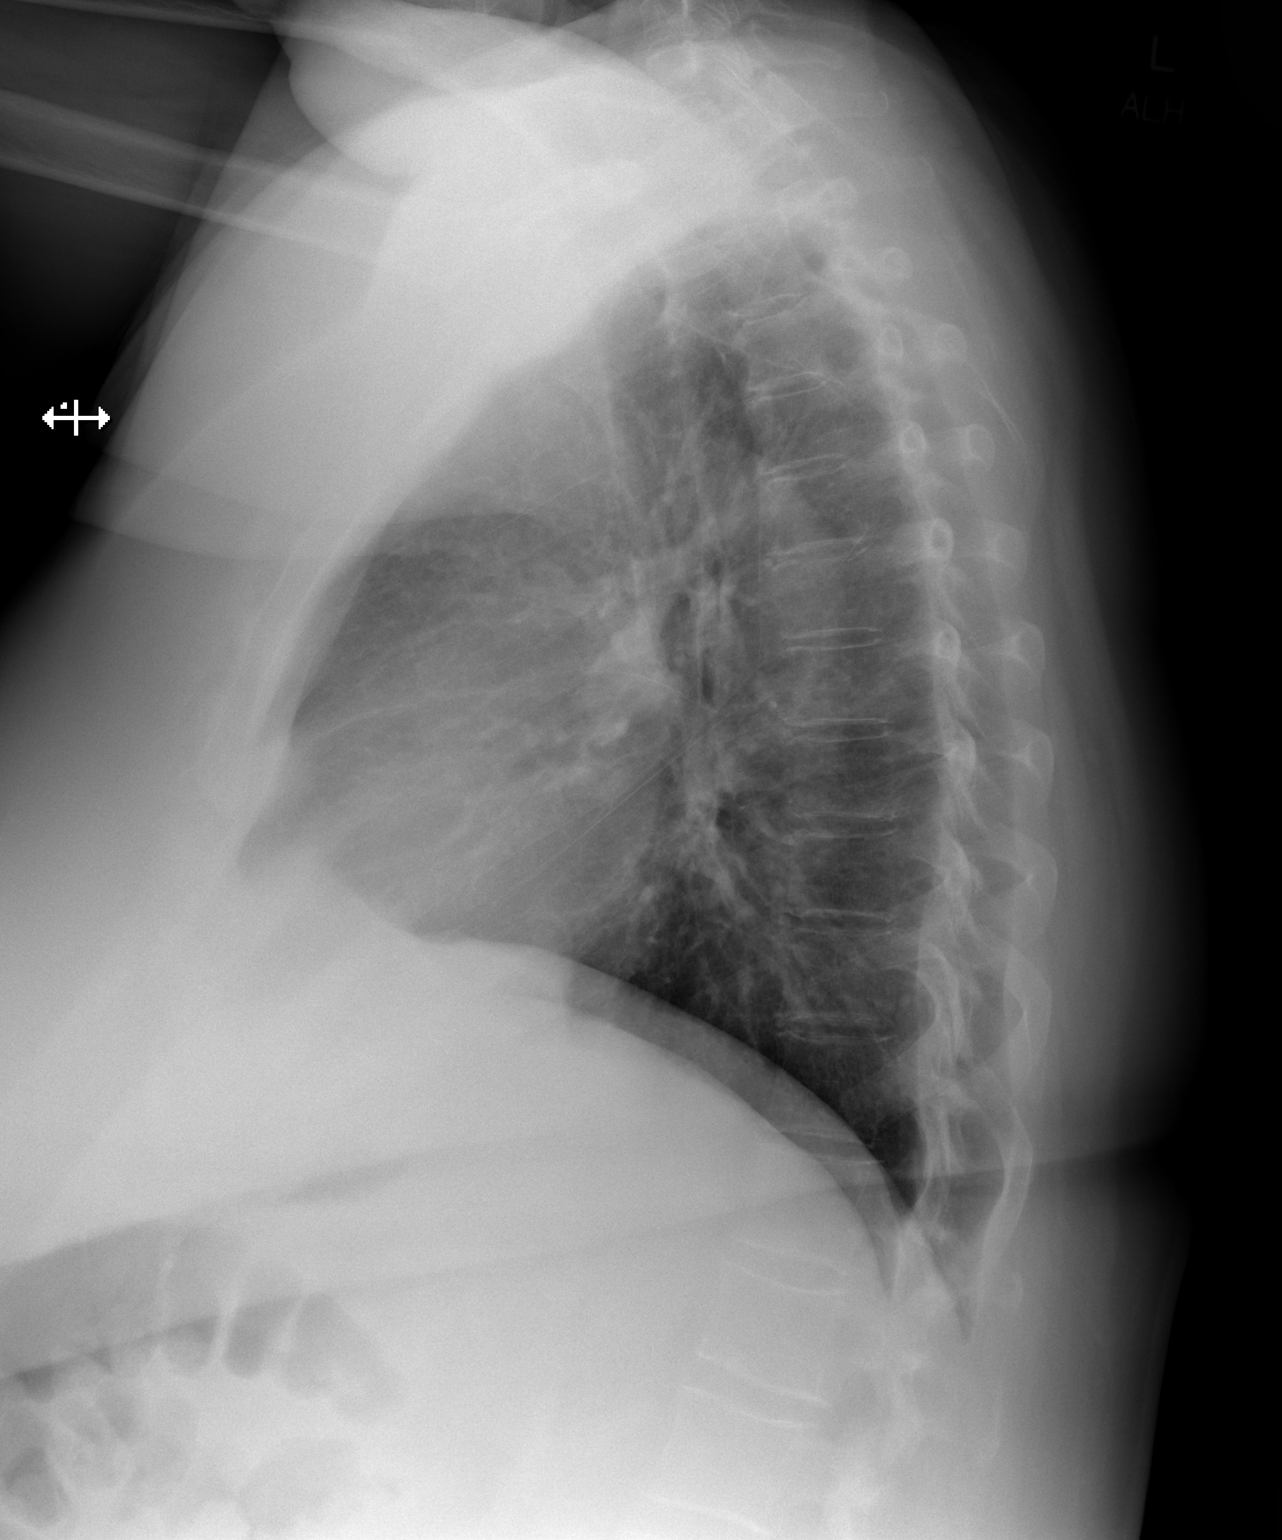

[2 of 2 positions shown; findings below may reference images not displayed]

FINDINGS: Cardiomediastinal silhouette within normal limits in size and
contour. No evidence of central vascular congestion. No interlobular
septal thickening.

No pneumothorax or pleural effusion. Coarsened interstitial
markings, with no confluent airspace disease.

No acute displaced fracture. Degenerative changes of the spine.
IMPRESSION: No active cardiopulmonary disease.

## 2022-10-10 ENCOUNTER — Other Ambulatory Visit (HOSPITAL_BASED_OUTPATIENT_CLINIC_OR_DEPARTMENT_OTHER): Payer: Self-pay

## 2022-10-10 MED ORDER — WEGOVY 0.25 MG/0.5ML ~~LOC~~ SOAJ: 0.2500 mg | SUBCUTANEOUS | 0 refills | Status: AC

## 2022-10-10 MED ORDER — WEGOVY 0.25 MG/0.5ML ~~LOC~~ SOAJ
0.2500 mg | SUBCUTANEOUS | 0 refills | Status: AC
  Filled 2022-10-10: qty 2, 28d supply, fill #0

## 2022-10-24 ENCOUNTER — Other Ambulatory Visit (HOSPITAL_COMMUNITY): Payer: Self-pay

## 2022-10-24 MED ORDER — WEGOVY 0.25 MG/0.5ML ~~LOC~~ SOAJ
0.2500 mg | SUBCUTANEOUS | 0 refills | Status: AC
  Filled 2022-10-24 – 2023-01-23 (×3): qty 2, 28d supply, fill #0

## 2022-10-30 ENCOUNTER — Other Ambulatory Visit (HOSPITAL_BASED_OUTPATIENT_CLINIC_OR_DEPARTMENT_OTHER): Payer: Self-pay

## 2023-01-02 ENCOUNTER — Other Ambulatory Visit (HOSPITAL_BASED_OUTPATIENT_CLINIC_OR_DEPARTMENT_OTHER): Payer: Self-pay

## 2023-01-03 ENCOUNTER — Other Ambulatory Visit (HOSPITAL_BASED_OUTPATIENT_CLINIC_OR_DEPARTMENT_OTHER): Payer: Self-pay

## 2023-01-07 ENCOUNTER — Other Ambulatory Visit (HOSPITAL_BASED_OUTPATIENT_CLINIC_OR_DEPARTMENT_OTHER): Payer: Self-pay

## 2023-01-08 ENCOUNTER — Other Ambulatory Visit: Payer: Self-pay

## 2023-01-08 ENCOUNTER — Other Ambulatory Visit (HOSPITAL_BASED_OUTPATIENT_CLINIC_OR_DEPARTMENT_OTHER): Payer: Self-pay

## 2023-01-13 ENCOUNTER — Other Ambulatory Visit (HOSPITAL_BASED_OUTPATIENT_CLINIC_OR_DEPARTMENT_OTHER): Payer: Self-pay

## 2023-01-23 ENCOUNTER — Other Ambulatory Visit (HOSPITAL_BASED_OUTPATIENT_CLINIC_OR_DEPARTMENT_OTHER): Payer: Self-pay

## 2023-02-04 ENCOUNTER — Other Ambulatory Visit (HOSPITAL_BASED_OUTPATIENT_CLINIC_OR_DEPARTMENT_OTHER): Payer: Self-pay

## 2023-02-04 MED ORDER — WEGOVY 0.5 MG/0.5ML ~~LOC~~ SOAJ
0.5000 mg | SUBCUTANEOUS | 0 refills | Status: DC
  Filled 2023-02-04: qty 2, 28d supply, fill #0

## 2023-02-05 ENCOUNTER — Other Ambulatory Visit (HOSPITAL_BASED_OUTPATIENT_CLINIC_OR_DEPARTMENT_OTHER): Payer: Self-pay

## 2023-02-06 ENCOUNTER — Other Ambulatory Visit (HOSPITAL_BASED_OUTPATIENT_CLINIC_OR_DEPARTMENT_OTHER): Payer: Self-pay

## 2023-02-09 ENCOUNTER — Other Ambulatory Visit (HOSPITAL_BASED_OUTPATIENT_CLINIC_OR_DEPARTMENT_OTHER): Payer: Self-pay

## 2023-02-10 ENCOUNTER — Other Ambulatory Visit: Payer: Self-pay

## 2023-02-13 ENCOUNTER — Other Ambulatory Visit (HOSPITAL_BASED_OUTPATIENT_CLINIC_OR_DEPARTMENT_OTHER): Payer: Self-pay

## 2023-02-28 ENCOUNTER — Other Ambulatory Visit: Payer: Self-pay

## 2023-03-06 ENCOUNTER — Other Ambulatory Visit (HOSPITAL_BASED_OUTPATIENT_CLINIC_OR_DEPARTMENT_OTHER): Payer: Self-pay

## 2023-03-06 MED ORDER — WEGOVY 0.5 MG/0.5ML ~~LOC~~ SOAJ
0.5000 mg | SUBCUTANEOUS | 0 refills | Status: AC
  Filled 2023-03-06: qty 2, 28d supply, fill #0

## 2023-04-16 ENCOUNTER — Other Ambulatory Visit (HOSPITAL_BASED_OUTPATIENT_CLINIC_OR_DEPARTMENT_OTHER): Payer: Self-pay

## 2023-04-16 MED ORDER — WEGOVY 1 MG/0.5ML ~~LOC~~ SOAJ
1.0000 mg | SUBCUTANEOUS | 0 refills | Status: AC
  Filled 2023-04-16: qty 2, 28d supply, fill #0

## 2023-04-17 ENCOUNTER — Other Ambulatory Visit (HOSPITAL_BASED_OUTPATIENT_CLINIC_OR_DEPARTMENT_OTHER): Payer: Self-pay

## 2023-06-05 ENCOUNTER — Other Ambulatory Visit (HOSPITAL_BASED_OUTPATIENT_CLINIC_OR_DEPARTMENT_OTHER): Payer: Self-pay

## 2023-06-05 MED ORDER — WEGOVY 1.7 MG/0.75ML ~~LOC~~ SOAJ
1.7000 mg | SUBCUTANEOUS | 1 refills | Status: AC
  Filled 2023-06-05: qty 3, 28d supply, fill #0
  Filled 2023-07-03: qty 3, 28d supply, fill #1

## 2023-07-03 ENCOUNTER — Other Ambulatory Visit (HOSPITAL_BASED_OUTPATIENT_CLINIC_OR_DEPARTMENT_OTHER): Payer: Self-pay

## 2024-01-09 ENCOUNTER — Emergency Department (HOSPITAL_COMMUNITY): Payer: PRIVATE HEALTH INSURANCE

## 2024-01-09 ENCOUNTER — Encounter (HOSPITAL_COMMUNITY): Payer: Self-pay | Admitting: Emergency Medicine

## 2024-01-09 ENCOUNTER — Emergency Department (HOSPITAL_COMMUNITY)
Admission: EM | Admit: 2024-01-09 | Discharge: 2024-01-09 | Disposition: A | Discharge status: Home / Self Care | Payer: PRIVATE HEALTH INSURANCE | Attending: Emergency Medicine | Admitting: Emergency Medicine

## 2024-01-09 ENCOUNTER — Other Ambulatory Visit: Payer: Self-pay

## 2024-01-09 DIAGNOSIS — M25569 Pain in unspecified knee: Secondary | ICD-10-CM | POA: Diagnosis not present

## 2024-01-09 DIAGNOSIS — R55 Syncope and collapse: Secondary | ICD-10-CM | POA: Diagnosis present

## 2024-01-09 DIAGNOSIS — Z96659 Presence of unspecified artificial knee joint: Secondary | ICD-10-CM | POA: Insufficient documentation

## 2024-01-09 DIAGNOSIS — E079 Disorder of thyroid, unspecified: Secondary | ICD-10-CM

## 2024-01-09 HISTORY — DX: Anxiety disorder, unspecified: F41.9

## 2024-01-09 HISTORY — DX: Disorder of thyroid, unspecified: E07.9

## 2024-01-09 HISTORY — DX: Depression, unspecified: F32.A

## 2024-01-09 LAB — BASIC METABOLIC PANEL
Anion gap: 8 (ref 5–15)
BUN: 9 mg/dL (ref 6–20)
CO2: 20 mmol/L — ABNORMAL LOW (ref 22–32)
Calcium: 7 mg/dL — ABNORMAL LOW (ref 8.9–10.3)
Chloride: 110 mmol/L (ref 98–111)
Creatinine, Ser: 0.67 mg/dL (ref 0.44–1.00)
GFR, Estimated: >60 mL/min (ref >60)
Glucose, Bld: 84 mg/dL (ref 70–99)
Potassium: 3.2 mmol/L — ABNORMAL LOW (ref 3.5–5.1)
Sodium: 138 mmol/L (ref 135–145)

## 2024-01-09 LAB — CBC
HCT: 34.1 % — ABNORMAL LOW (ref 36.0–46.0)
Hemoglobin: 10.7 g/dL — ABNORMAL LOW (ref 12.0–15.0)
MCH: 29.2 pg (ref 26.0–34.0)
MCHC: 31.4 g/dL (ref 30.0–36.0)
MCV: 93.2 fL (ref 80.0–100.0)
Platelets: 288 10*3/uL (ref 150–400)
RBC: 3.66 MIL/uL — ABNORMAL LOW (ref 3.87–5.11)
RDW: 14.5 % (ref 11.5–15.5)
WBC: 7.1 10*3/uL (ref 4.0–10.5)
nRBC: 0 % (ref 0.0–0.2)

## 2024-01-09 MED ORDER — POTASSIUM CHLORIDE CRYS ER 20 MEQ PO TBCR
40.0000 meq | EXTENDED_RELEASE_TABLET | Freq: Once | ORAL | Status: AC
  Administered 2024-01-09: 40 meq via ORAL
  Filled 2024-01-09: qty 4

## 2024-01-09 MED ORDER — OXYCODONE-ACETAMINOPHEN 5-325 MG PO TABS
1.0000 | ORAL_TABLET | Freq: Once | ORAL | Status: AC
  Administered 2024-01-09: 1 via ORAL
  Filled 2024-01-09: qty 1

## 2024-01-09 NOTE — ED Provider Notes (Signed)
Patient with vasovagal syncope.  She has mild hypokalemia and mild decreased calcium.  She stopped taking her calcium supplement prior to her knee surgery 2 weeks ago.  Patient is given some potassium here in the emergency department and told to start back on her calcium supplements and follow-up with her PCP next week   Bethann Berkshire, MD 01/09/24 [phone]

## 2024-01-09 NOTE — ED Triage Notes (Signed)
Patient presents from [facility] post loss of consciousness. 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 Before her appointment, she did not eat or drink, but did take her pain medication. While sitting on a bike to stretch during her rehab visit, she had a moment of significant pain, became diaphoretic and unresponsive. She did not fall. She does not recall the event and has no complaints at the moment. EMS administered 250 cc of fluid.    EMS vitals: BP-     112/74 laying down  96/68 sitting  80/40 standing 72-80 HR 20 RR 95 % SPO2 on room air 134 CBG 15 GCS

## 2024-01-09 NOTE — Discharge Instructions (Signed)
Start back taking your calcium supplements.  And follow-up with your family doctor next week for recheck

## 2024-01-09 NOTE — ED Provider Notes (Signed)
[facility] EMERGENCY DEPARTMENT AT [facility] Provider Note   CSN: [id number] Arrival date & time: 01/09/24  1313     History {Add pertinent medical, surgical, social history, OB history to HPI:1} Chief Complaint  Patient presents with   Loss of Consciousness   Knee Pain    Kimberly Boyer is a 63 y.o. female.   Loss of Consciousness Knee Pain    Patient has a history of anxiety depression and recent knee replacement done 1 week ago.  Patient states she went to rehab today.  She had not eaten anything.  She did not drink a lot of fluids.  She had been taking her pain medications.  Patient states she was on the exercise bike and was trying to push herself.  She had significant pain in her knee temporarily.  Patient began feeling lightheaded diaphoretic.  Patient apparently became unresponsive at physical therapy although she did not fall.  Patient was given IV fluids.  Right now she states she feels fine.  She is not having any significant knee pain.  She has not any chest pain or shortness of breath.  No abdominal pain.  No fevers or chills.  Patient states she has had trouble with syncope in the past especially with pain.  Patient states when she fractured her arm in the past she had episodes of syncope associated with that.  Home Medications Prior to Admission medications   Medication Sig Start Date End Date Taking? Authorizing Provider  lidocaine (LIDODERM) 5 % Place 1 patch onto the skin daily. Remove & Discard patch within 12 hours or as directed by MD 02/06/20   Joy, Shawn C, PA-C  methocarbamol (ROBAXIN) 500 MG tablet Take 1 tablet (500 mg total) by mouth 2 (two) times daily. 02/06/20   Joy, Shawn C, PA-C  naproxen (NAPROSYN) 500 MG tablet Take 1 tablet (500 mg total) by mouth 2 (two) times daily as needed for moderate pain. 04/24/20   Long, Arlyss Repress, MD  oxyCODONE-acetaminophen (PERCOCET/ROXICET) 5-325 MG tablet Take 1 tablet by mouth every 6 (six) hours as needed for  severe pain. 04/24/20   Long, Arlyss Repress, MD  Semaglutide-Weight Management (WEGOVY) 0.25 MG/0.5ML SOAJ Inject 0.25 mg into the skin once a week. 10/10/22     Semaglutide-Weight Management (WEGOVY) 0.25 MG/0.5ML SOAJ Inject 0.25 mg into the skin once a week. 10/10/22     Semaglutide-Weight Management (WEGOVY) 0.25 MG/0.5ML SOAJ Inject 0.25 mg into the skin once a week. 10/24/22     Semaglutide-Weight Management (WEGOVY) 0.5 MG/0.5ML SOAJ Take 0.5 mg by mouth once a week. 03/06/23     Semaglutide-Weight Management (WEGOVY) 1 MG/0.5ML SOAJ Inject 1 mg into the skin once a week. 04/16/23     Semaglutide-Weight Management (WEGOVY) 1.7 MG/0.75ML SOAJ Inject 1.7 mg into the skin once a week. 06/05/23         Allergies    Patient has no known allergies.    Review of Systems   Review of Systems  Cardiovascular:  Positive for syncope.    Physical Exam Updated Vital Signs BP 111/70   Pulse 74   Temp 97.6 F (36.4 C) (Oral)   Resp 15   SpO2 94%  Physical Exam Vitals and nursing note reviewed.  Constitutional:      General: She is not in acute distress.    Appearance: She is well-developed.  HENT:     Head: Normocephalic and atraumatic.     Right Ear: External ear normal.  Left Ear: External ear normal.  Eyes:     General: No scleral icterus.       Right eye: No discharge.        Left eye: No discharge.     Conjunctiva/sclera: Conjunctivae normal.  Neck:     Trachea: No tracheal deviation.  Cardiovascular:     Rate and Rhythm: Normal rate and regular rhythm.  Pulmonary:     Effort: Pulmonary effort is normal. No respiratory distress.     Breath sounds: Normal breath sounds. No stridor. No wheezing or rales.  Abdominal:     General: Bowel sounds are normal. There is no distension.     Palpations: Abdomen is soft.     Tenderness: There is no abdominal tenderness. There is no guarding or rebound.  Musculoskeletal:        General: No tenderness or deformity.     Cervical back: Neck  supple.     Comments: No calf swelling or tenderness, no surrounding erythema around the surgical site of her left knee.  tenderness to palpation as expected after her knee surgery  Skin:    General: Skin is warm and dry.     Findings: No rash.  Neurological:     General: No focal deficit present.     Mental Status: She is alert.     Cranial Nerves: No cranial nerve deficit, dysarthria or facial asymmetry.     Sensory: No sensory deficit.     Motor: No abnormal muscle tone or seizure activity.     Coordination: Coordination normal.  Psychiatric:        Mood and Affect: Mood normal.     ED Results / Procedures / Treatments   Labs (all labs ordered are listed, but only abnormal results are displayed) Labs Reviewed - No data to display  EKG None  Radiology No results found.  Procedures Procedures  {Document cardiac monitor, telemetry assessment procedure when appropriate:1}  Medications Ordered in ED Medications - No data to display  ED Course/ Medical Decision Making/ A&P   {   Click here for ABCD2, HEART and other calculatorsREFRESH Note before signing :1}                              Medical Decision Making  ***  {Document critical care time when appropriate:1} {Document review of labs and clinical decision tools ie heart score, Chads2Vasc2 etc:1}  {Document your independent review of radiology images, and any outside records:1} {Document your discussion with family members, caretakers, and with consultants:1} {Document social determinants of health affecting pt's care:1} {Document your decision making why or why not admission, treatments were needed:1} Final Clinical Impression(s) / ED Diagnoses Final diagnoses:  None    Rx / DC Orders ED Discharge Orders     None

## 2024-04-28 ENCOUNTER — Other Ambulatory Visit (HOSPITAL_BASED_OUTPATIENT_CLINIC_OR_DEPARTMENT_OTHER): Payer: Self-pay

## 2024-04-28 MED ORDER — WEGOVY 2.4 MG/0.75ML ~~LOC~~ SOAJ
2.4000 mg | SUBCUTANEOUS | 2 refills | Status: AC
  Filled 2024-04-28: qty 3, 28d supply, fill #0

## 2024-05-03 ENCOUNTER — Other Ambulatory Visit (HOSPITAL_BASED_OUTPATIENT_CLINIC_OR_DEPARTMENT_OTHER): Payer: Self-pay

## 2024-05-04 ENCOUNTER — Other Ambulatory Visit (HOSPITAL_BASED_OUTPATIENT_CLINIC_OR_DEPARTMENT_OTHER): Payer: Self-pay

## 2024-09-30 ENCOUNTER — Other Ambulatory Visit (HOSPITAL_BASED_OUTPATIENT_CLINIC_OR_DEPARTMENT_OTHER): Payer: Self-pay | Admitting: Family Medicine

## 2024-09-30 DIAGNOSIS — E7800 Pure hypercholesterolemia, unspecified: Secondary | ICD-10-CM

## 2024-10-12 ENCOUNTER — Ambulatory Visit (HOSPITAL_BASED_OUTPATIENT_CLINIC_OR_DEPARTMENT_OTHER)
Admission: RE | Admit: 2024-10-12 | Discharge: 2024-10-12 | Disposition: A | Discharge status: Home / Self Care | Payer: PRIVATE HEALTH INSURANCE | Source: Ambulatory Visit | Attending: Family Medicine | Admitting: Family Medicine

## 2024-10-12 DIAGNOSIS — E7800 Pure hypercholesterolemia, unspecified: Secondary | ICD-10-CM | POA: Insufficient documentation
